# Patient Record
Sex: Female | Born: 1945 | Race: White | Hispanic: No | Smoking: Former smoker
Health system: Southern US, Community
[De-identification: ages and names within clinical notes are randomized; demographics above are authoritative.]

## PROBLEM LIST (undated history)

## (undated) DIAGNOSIS — E559 Vitamin D deficiency, unspecified: Secondary | ICD-10-CM

## (undated) DIAGNOSIS — E785 Hyperlipidemia, unspecified: Secondary | ICD-10-CM

## (undated) DIAGNOSIS — I219 Acute myocardial infarction, unspecified: Secondary | ICD-10-CM

## (undated) DIAGNOSIS — I739 Peripheral vascular disease, unspecified: Secondary | ICD-10-CM

## (undated) DIAGNOSIS — F329 Major depressive disorder, single episode, unspecified: Secondary | ICD-10-CM

## (undated) DIAGNOSIS — I2510 Atherosclerotic heart disease of native coronary artery without angina pectoris: Secondary | ICD-10-CM

## (undated) DIAGNOSIS — I252 Old myocardial infarction: Secondary | ICD-10-CM

## (undated) DIAGNOSIS — G43909 Migraine, unspecified, not intractable, without status migrainosus: Secondary | ICD-10-CM

## (undated) DIAGNOSIS — I10 Essential (primary) hypertension: Secondary | ICD-10-CM

## (undated) DIAGNOSIS — F32A Depression, unspecified: Secondary | ICD-10-CM

## (undated) DIAGNOSIS — F419 Anxiety disorder, unspecified: Secondary | ICD-10-CM

## (undated) HISTORY — DX: Migraine, unspecified, not intractable, without status migrainosus: G43.909

## (undated) HISTORY — DX: Anxiety disorder, unspecified: F41.9

## (undated) HISTORY — DX: Atherosclerotic heart disease of native coronary artery without angina pectoris: I25.10

## (undated) HISTORY — DX: Old myocardial infarction: I25.2

## (undated) HISTORY — DX: Vitamin D deficiency, unspecified: E55.9

## (undated) HISTORY — DX: Peripheral vascular disease, unspecified: I73.9

---

## 1991-02-04 HISTORY — PX: ABDOMINAL HYSTERECTOMY: SHX81

## 2003-12-18 ENCOUNTER — Ambulatory Visit: Payer: Self-pay | Admitting: *Deleted

## 2006-03-06 HISTORY — PX: CORONARY STENT PLACEMENT: SHX1402

## 2006-03-17 ENCOUNTER — Emergency Department: Payer: Self-pay | Admitting: Emergency Medicine

## 2006-03-17 ENCOUNTER — Other Ambulatory Visit: Payer: Self-pay

## 2008-02-15 ENCOUNTER — Ambulatory Visit: Payer: Self-pay | Admitting: Family Medicine

## 2009-05-03 ENCOUNTER — Ambulatory Visit: Payer: Self-pay | Admitting: Internal Medicine

## 2011-09-14 ENCOUNTER — Emergency Department: Payer: Self-pay | Admitting: Emergency Medicine

## 2011-09-14 LAB — CBC
HGB: 14.6 g/dL (ref 12.0–16.0)
MCH: 29.7 pg (ref 26.0–34.0)
MCHC: 34.7 g/dL (ref 32.0–36.0)
Platelet: 210 10*3/uL (ref 150–440)
RBC: 4.91 10*6/uL (ref 3.80–5.20)
WBC: 9.5 10*3/uL (ref 3.6–11.0)

## 2011-09-14 LAB — COMPREHENSIVE METABOLIC PANEL
Anion Gap: 9 (ref 7–16)
BUN: 13 mg/dL (ref 7–18)
Bilirubin,Total: 1.1 mg/dL — ABNORMAL HIGH (ref 0.2–1.0)
Chloride: 106 mmol/L (ref 98–107)
Creatinine: 1.02 mg/dL (ref 0.60–1.30)
EGFR (African American): >60
Osmolality: 287 (ref 275–301)
Potassium: 4.6 mmol/L (ref 3.5–5.1)
SGOT(AST): 23 U/L (ref 15–37)
Total Protein: 7.8 g/dL (ref 6.4–8.2)

## 2012-04-24 ENCOUNTER — Emergency Department: Payer: Self-pay | Admitting: Emergency Medicine

## 2012-12-06 IMAGING — CR DG CHEST 2V
1 series · 2 of 2 positions shown · non-contrast
Comparison: none

REASON FOR EXAM: htn
COMMENTS:

PROCEDURE:     DXR - DXR CHEST PA (OR AP) AND LATERAL  - September 14, 2011  [DATE]
RESULT:     The lungs are clear. The cardiac silhouette and visualized bony
skeleton are unremarkable.

[Series 1: pa · 0.17mm/px · 2 of 2 slices shown]
[im 1/2]
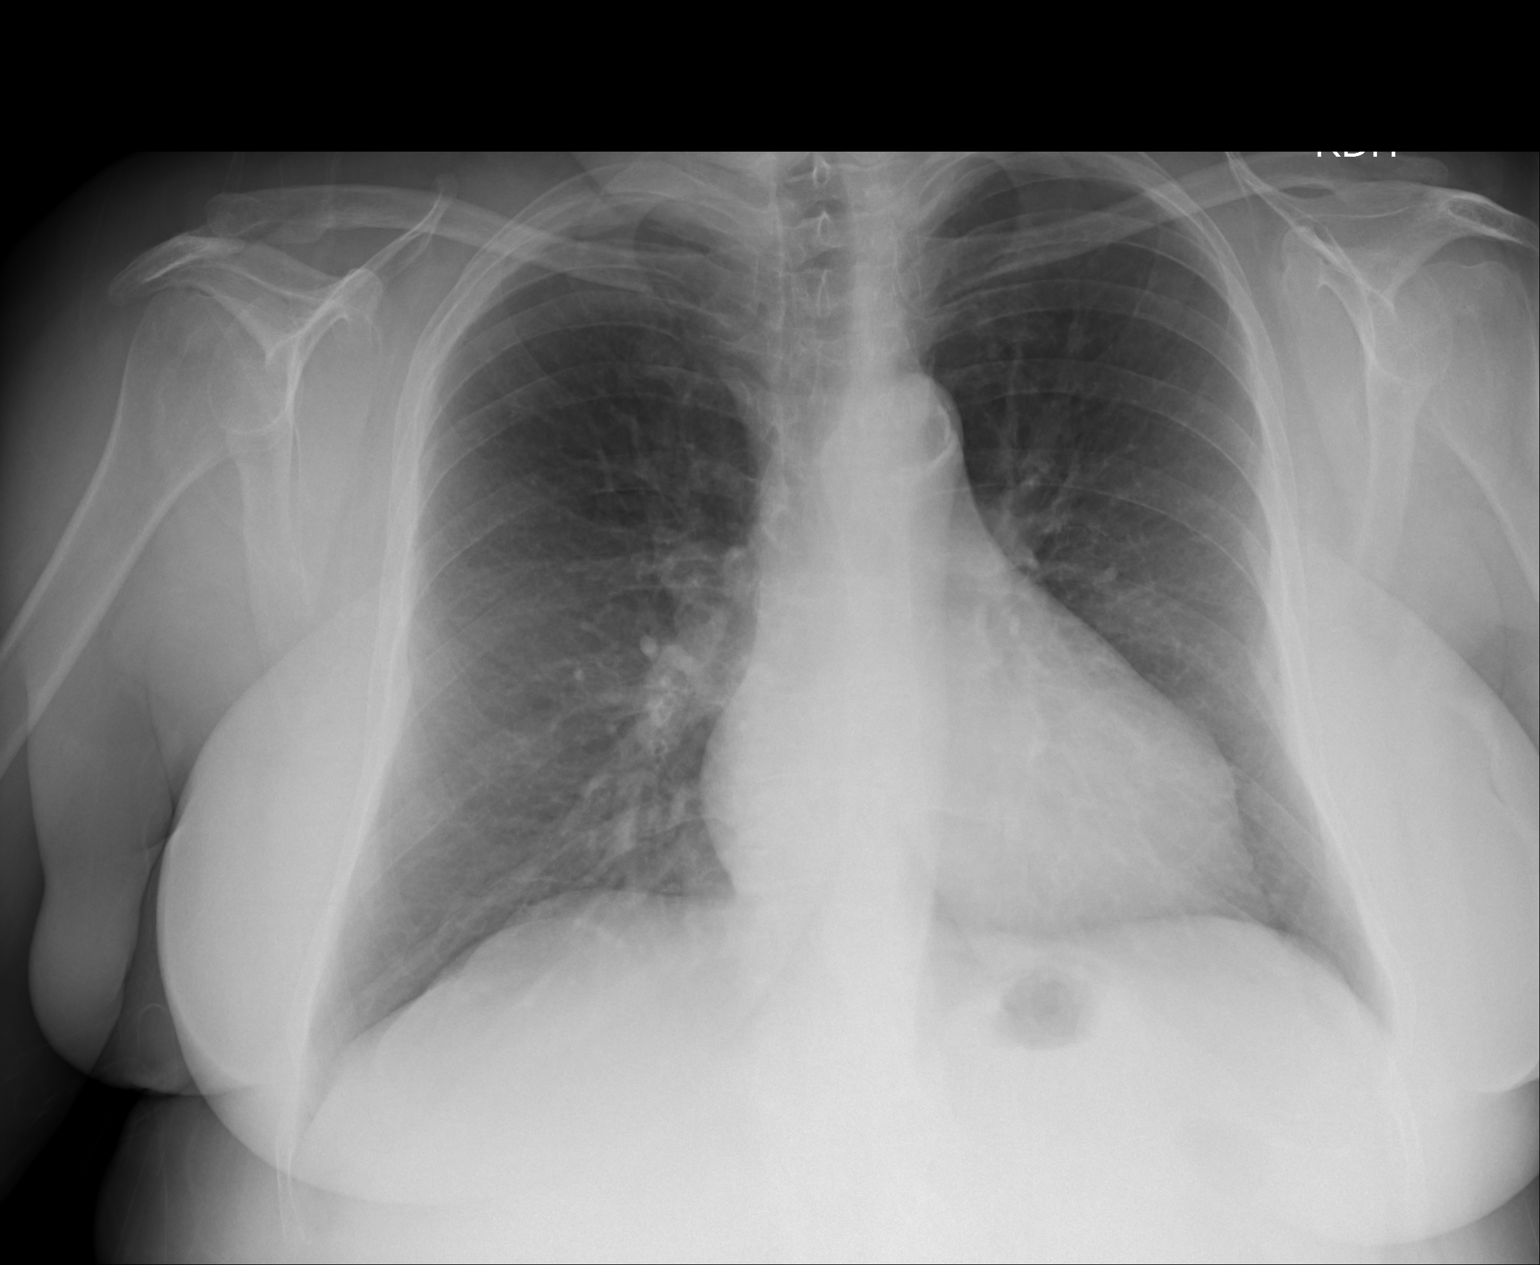
[im 2/2]
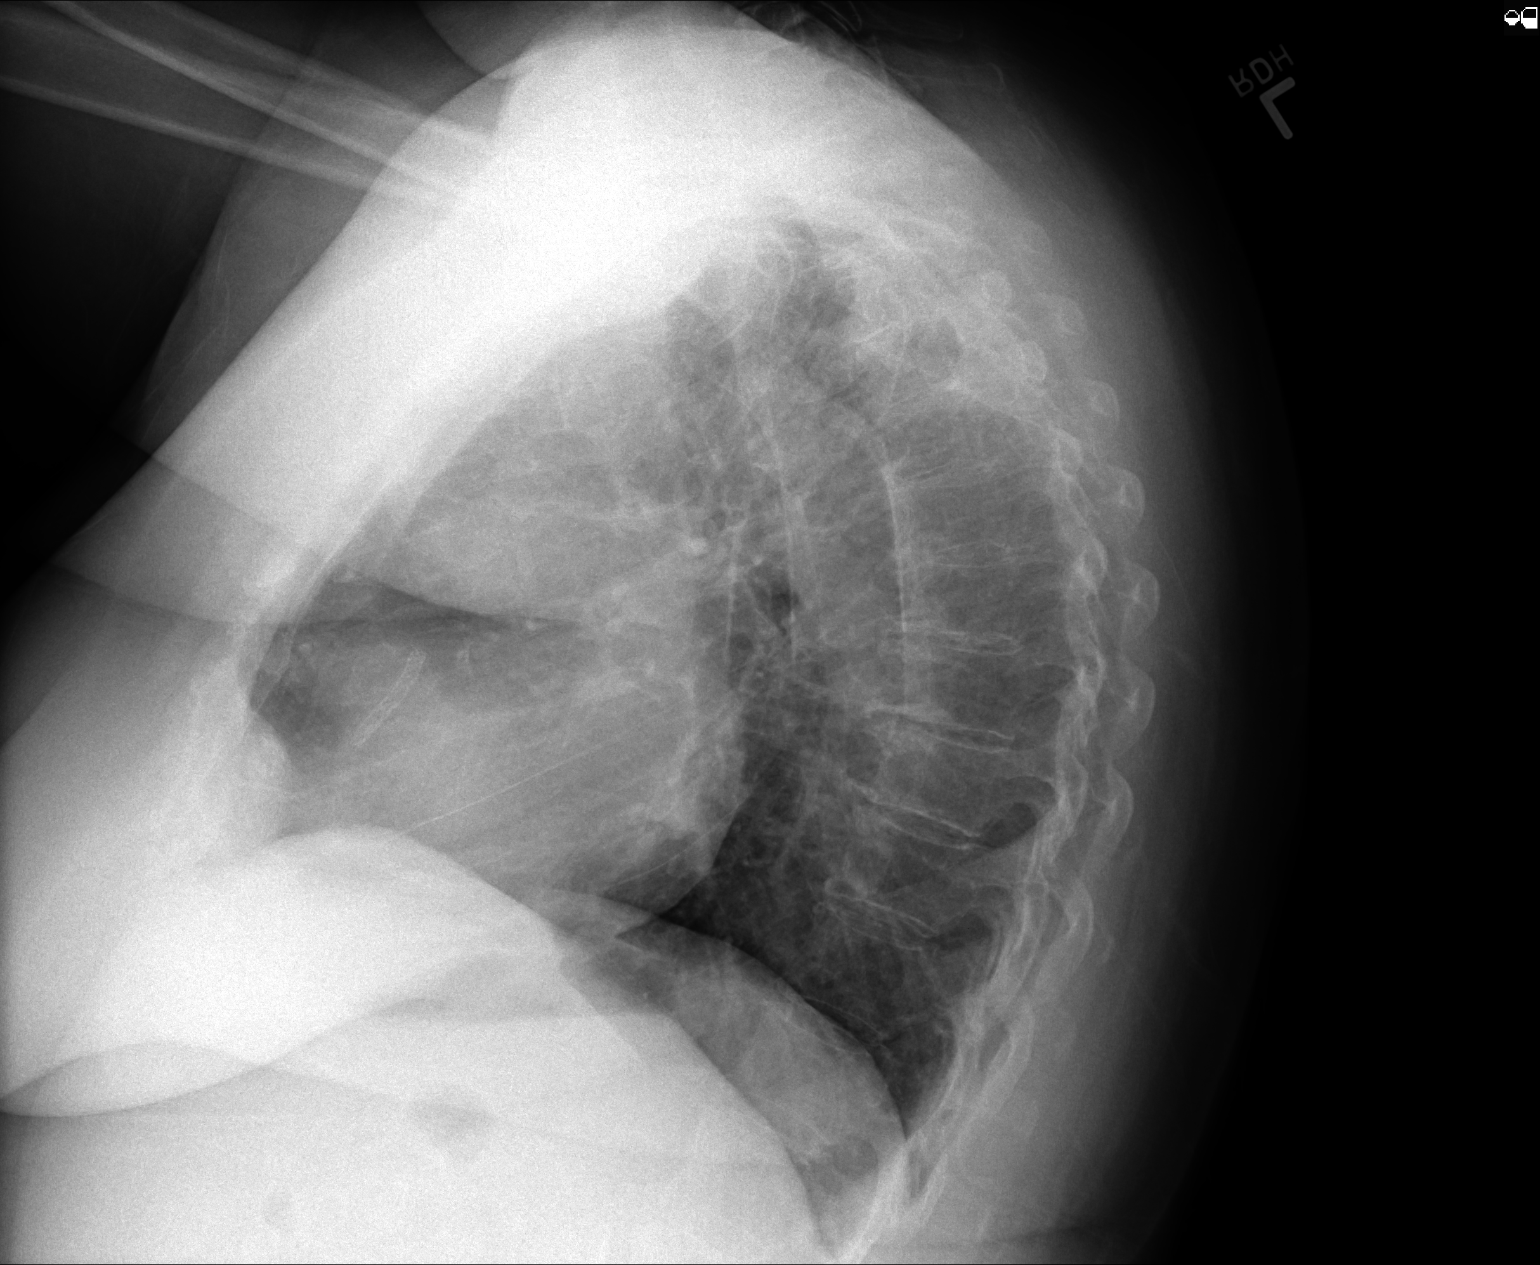

[2 of 2 positions shown; findings below may reference images not displayed]

IMPRESSION: 1.  Chest radiograph without evidence of acute cardiopulmonary disease.
2.  Comparison is made to a prior study dated 05/03/2009.

## 2013-07-17 IMAGING — CT CT HEAD WITHOUT CONTRAST
1 series · 16 of 30 positions shown, 20 images · non-contrast
Comparison: none

REASON FOR EXAM: head injury on asa
COMMENTS:   LMP: Post-Menopausal

[Series 2: soft tissue · axial · 0.43mm/px · z∈[-155,-20]mm · 16 of 30 slices shown, 20 images]
[im 2/30  brain]
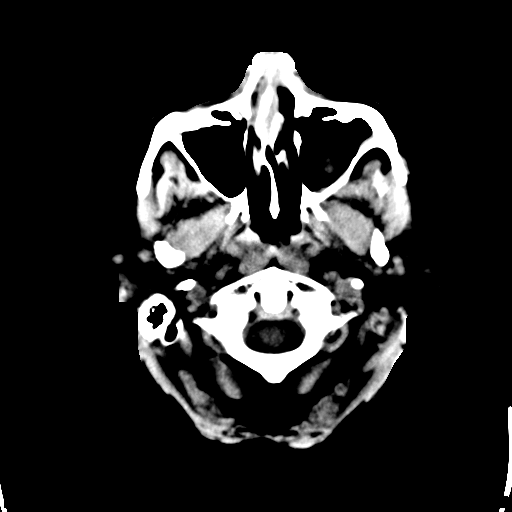
[im 2/30  bone]
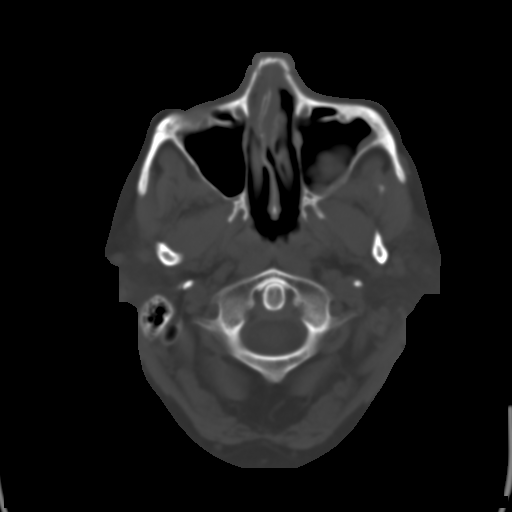
[im 4/30  brain]
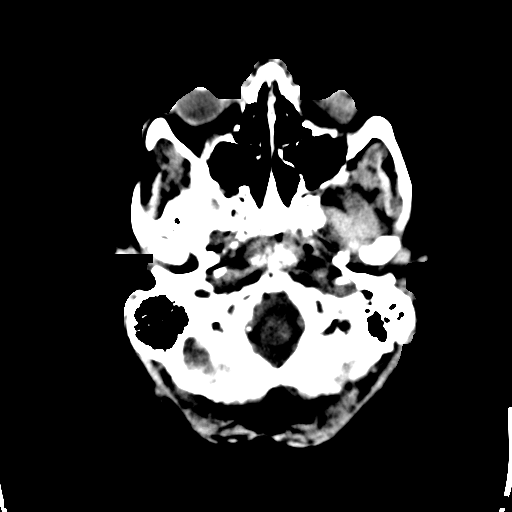
[im 6/30  brain]
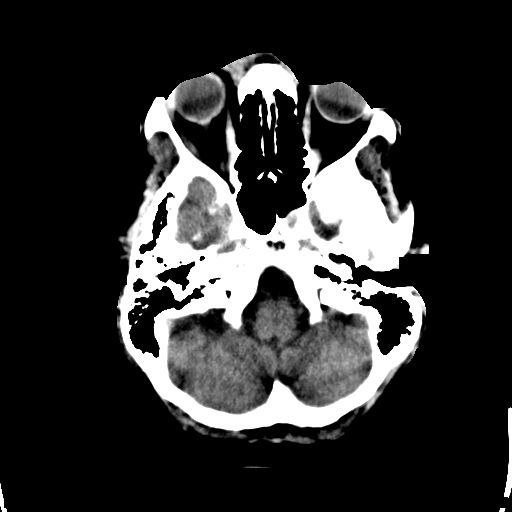
[im 8/30  brain]
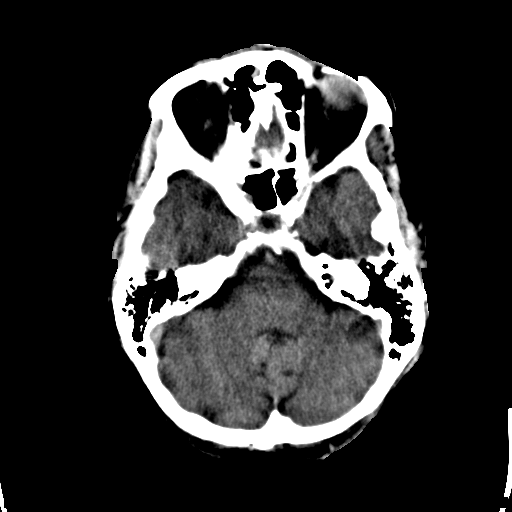
[im 9/30  brain]
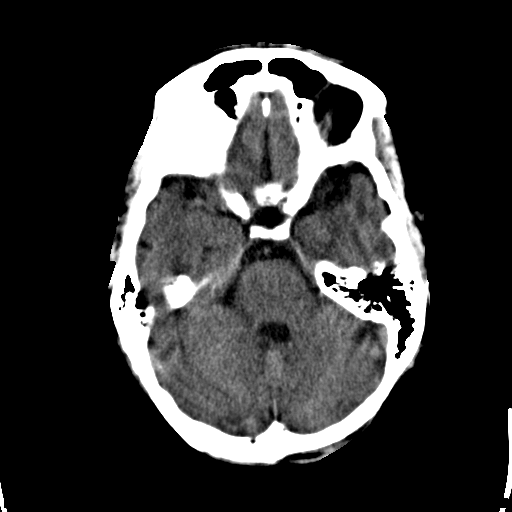
[im 9/30  bone]
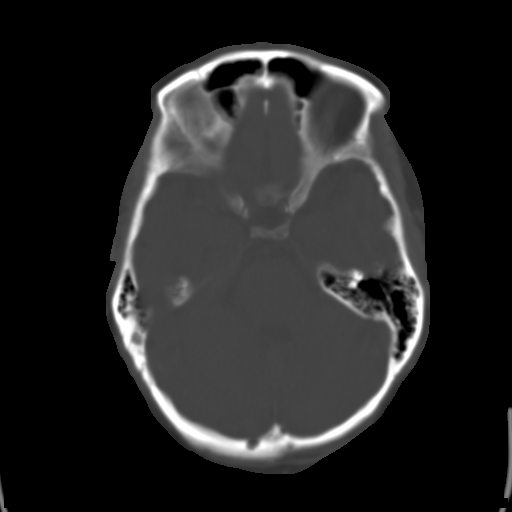
[im 11/30  brain]
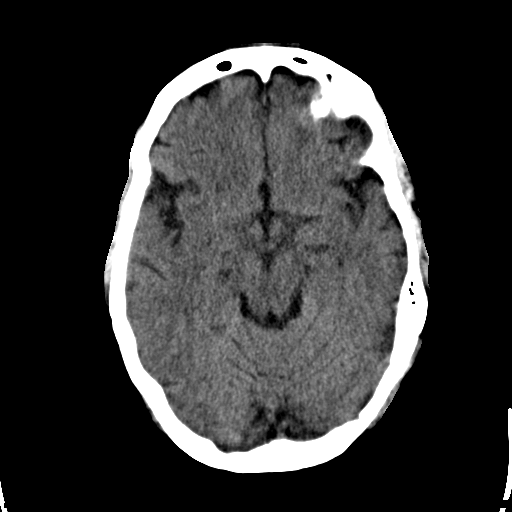
[im 13/30  brain]
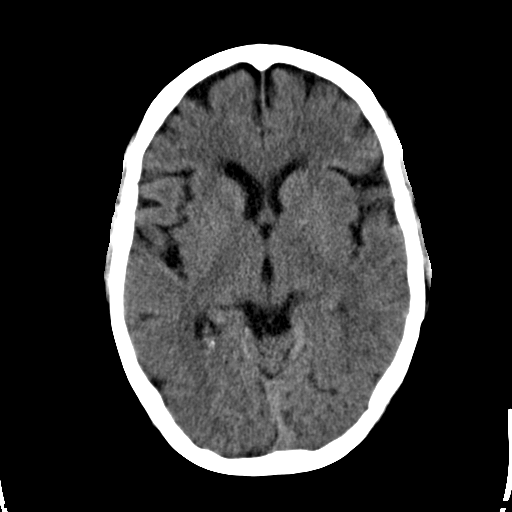
[im 15/30  brain]
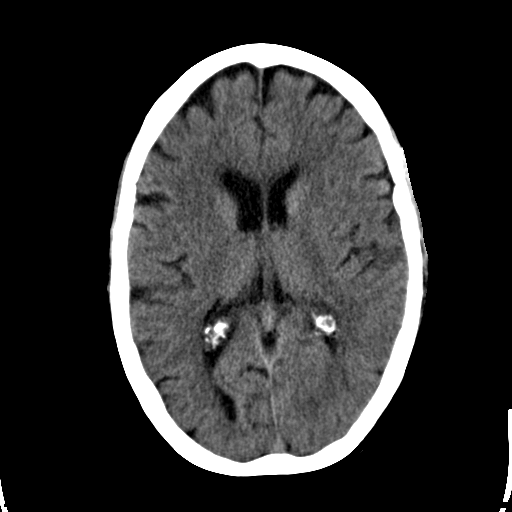
[im 16/30  brain]
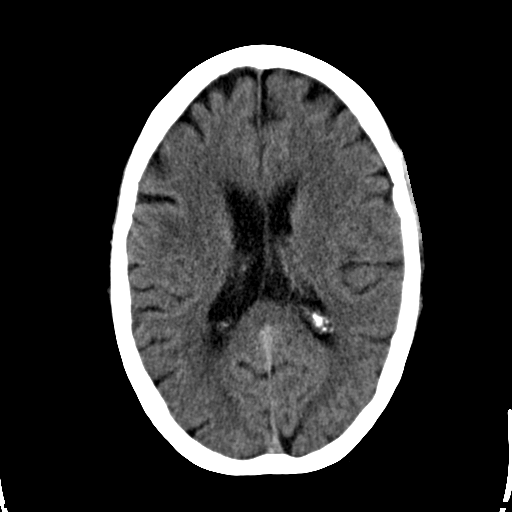
[im 16/30  bone]
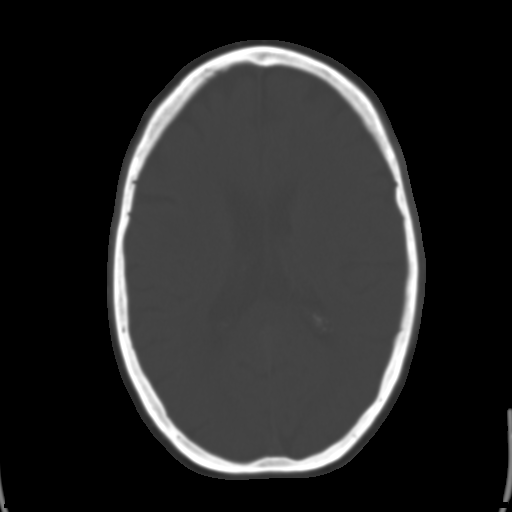
[im 18/30  brain]
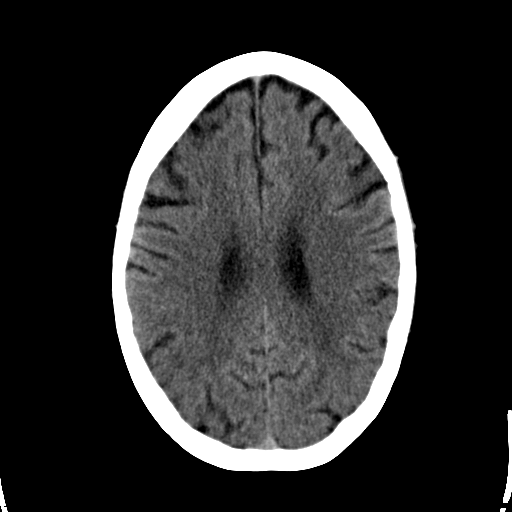
[im 20/30  brain]
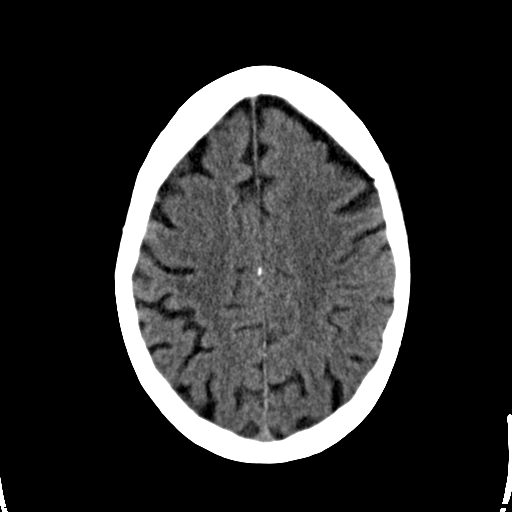
[im 22/30  brain]
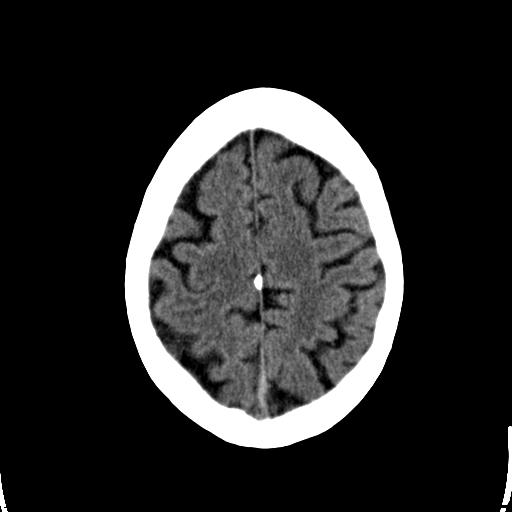
[im 23/30  brain]
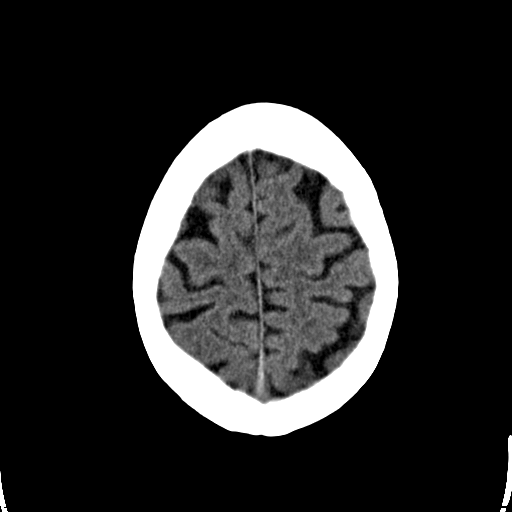
[im 23/30  bone]
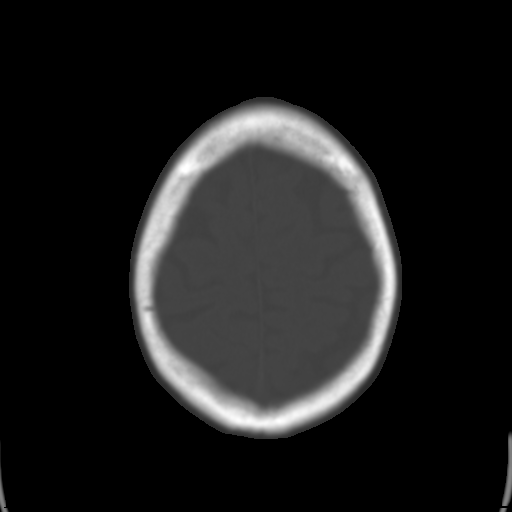
[im 25/30  brain]
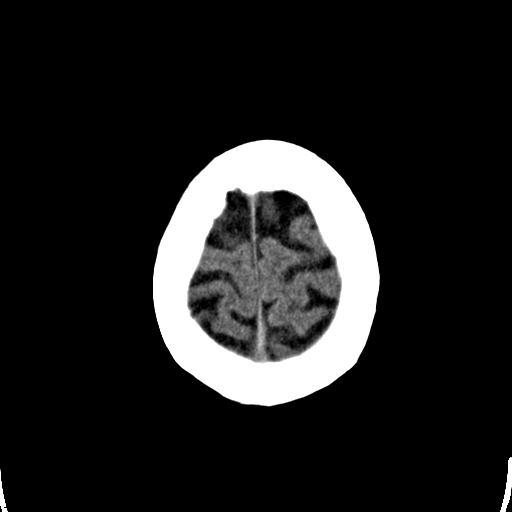
[im 27/30  brain]
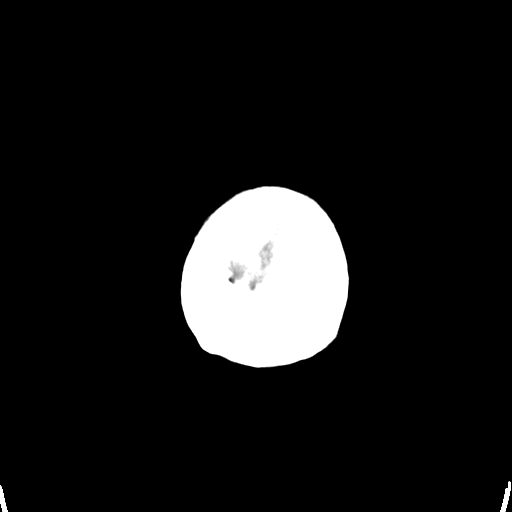
[im 29/30  brain]
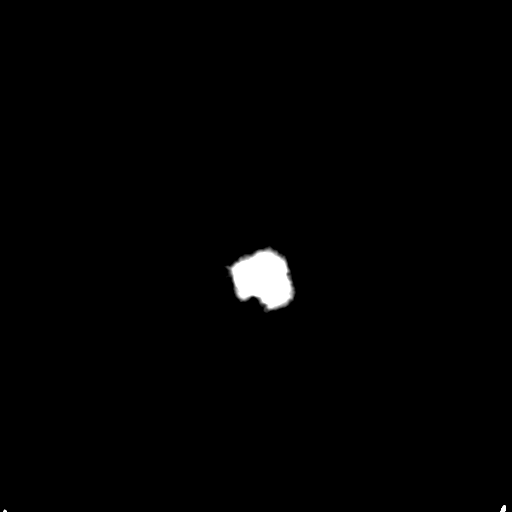

[16 of 30 positions shown; findings below may reference images not displayed]

PROCEDURE:     CT  - CT HEAD WITHOUT CONTRAST  - April 24, 2012  [DATE]

RESULT:     Noncontrast CT of the brain is performed. The patient has no
previous exam for comparison.

There is a moderately large mucous retention cyst in the left maxillary
sinus measuring up to is much as 2.5 cm. There is prominence of the
ventricles and sulci consistent with mild atrophy within normal limits for
the patient's age. There is no intracranial hemorrhage, mass, mass effect or
large territorial infarct. There is nasal bone fracture bilaterally with
displacement. The calvarium appears to be intact.
IMPRESSION: 1. Bilateral nasal bone fractures. No acute intracranial abnormality. Left
maxillary sinus mucous retention cysts. Soft tissue hematoma in the
supranasal and frontal region.

[REDACTED]

## 2014-07-12 ENCOUNTER — Other Ambulatory Visit: Payer: Self-pay | Admitting: Family Medicine

## 2014-07-12 DIAGNOSIS — I10 Essential (primary) hypertension: Secondary | ICD-10-CM

## 2014-07-14 ENCOUNTER — Other Ambulatory Visit: Payer: Self-pay | Admitting: Family Medicine

## 2014-07-14 DIAGNOSIS — I10 Essential (primary) hypertension: Secondary | ICD-10-CM

## 2014-07-14 NOTE — Telephone Encounter (Signed)
Pt is requesting a refill on Clonidine HCI 0.1mg . It was last sent in on 09/20/11 by Dr. Sherrie Mustache and was last seen for OV 07/12/13 with Nadine Counts. Pt stated she can not come in today and doesn't know when she can come because of her schedule. Pt stated her BP was 185/85 and that when she went to hospital awhile back ( pt couldn't remember the when this was) she was told to always have this medication with her. I advised pt that it is very important for her to come in for follow up. I advised pt if her BP got higher she needed to call back. Thanks TNP

## 2014-07-18 ENCOUNTER — Encounter: Payer: Self-pay | Admitting: Emergency Medicine

## 2014-07-18 ENCOUNTER — Other Ambulatory Visit: Payer: Self-pay

## 2014-07-18 ENCOUNTER — Telehealth: Payer: Self-pay | Admitting: Family Medicine

## 2014-07-18 ENCOUNTER — Other Ambulatory Visit: Payer: Self-pay | Admitting: Family Medicine

## 2014-07-18 ENCOUNTER — Emergency Department
Admission: EM | Admit: 2014-07-18 | Discharge: 2014-07-18 | Disposition: A | Discharge status: Home / Self Care | Payer: PPO | Attending: Emergency Medicine | Admitting: Emergency Medicine

## 2014-07-18 DIAGNOSIS — Z87891 Personal history of nicotine dependence: Secondary | ICD-10-CM | POA: Insufficient documentation

## 2014-07-18 DIAGNOSIS — I10 Essential (primary) hypertension: Secondary | ICD-10-CM | POA: Insufficient documentation

## 2014-07-18 DIAGNOSIS — F419 Anxiety disorder, unspecified: Secondary | ICD-10-CM | POA: Insufficient documentation

## 2014-07-18 DIAGNOSIS — Z79899 Other long term (current) drug therapy: Secondary | ICD-10-CM | POA: Diagnosis not present

## 2014-07-18 DIAGNOSIS — I252 Old myocardial infarction: Secondary | ICD-10-CM | POA: Diagnosis not present

## 2014-07-18 DIAGNOSIS — M79602 Pain in left arm: Secondary | ICD-10-CM | POA: Diagnosis not present

## 2014-07-18 DIAGNOSIS — Z7982 Long term (current) use of aspirin: Secondary | ICD-10-CM | POA: Diagnosis not present

## 2014-07-18 DIAGNOSIS — Z880 Allergy status to penicillin: Secondary | ICD-10-CM | POA: Diagnosis not present

## 2014-07-18 HISTORY — DX: Essential (primary) hypertension: I10

## 2014-07-18 HISTORY — DX: Depression, unspecified: F32.A

## 2014-07-18 HISTORY — DX: Major depressive disorder, single episode, unspecified: F32.9

## 2014-07-18 HISTORY — DX: Hyperlipidemia, unspecified: E78.5

## 2014-07-18 HISTORY — DX: Acute myocardial infarction, unspecified: I21.9

## 2014-07-18 LAB — TROPONIN I: Troponin I: <0.03 ng/mL (ref <0.031)

## 2014-07-18 LAB — CBC
HCT: 39.7 % (ref 35.0–47.0)
HEMOGLOBIN: 13.4 g/dL (ref 12.0–16.0)
MCH: 29.6 pg (ref 26.0–34.0)
MCHC: 33.8 g/dL (ref 32.0–36.0)
MCV: 87.4 fL (ref 80.0–100.0)
Platelets: 212 10*3/uL (ref 150–440)
RBC: 4.54 MIL/uL (ref 3.80–5.20)
RDW: 14.3 % (ref 11.5–14.5)
WBC: 7.9 10*3/uL (ref 3.6–11.0)

## 2014-07-18 LAB — BASIC METABOLIC PANEL
Anion gap: 12 (ref 5–15)
BUN: 13 mg/dL (ref 6–20)
CHLORIDE: 104 mmol/L (ref 101–111)
CO2: 23 mmol/L (ref 22–32)
CREATININE: 1.05 mg/dL — AB (ref 0.44–1.00)
Calcium: 8.8 mg/dL — ABNORMAL LOW (ref 8.9–10.3)
GFR calc non Af Amer: 53 mL/min — ABNORMAL LOW (ref >60)
Glucose, Bld: 153 mg/dL — ABNORMAL HIGH (ref 65–99)
Potassium: 3.9 mmol/L (ref 3.5–5.1)
Sodium: 139 mmol/L (ref 135–145)

## 2014-07-18 MED ORDER — CLONIDINE HCL 0.1 MG PO TABS: 0.1000 mg | ORAL_TABLET | ORAL | Status: AC

## 2014-07-18 NOTE — Discharge Instructions (Signed)

## 2014-07-18 NOTE — ED Provider Notes (Signed)
[facility] Emergency Department Provider Note  ____________________________________________  Time seen: On arrival  I have reviewed the triage vital signs and the nursing notes.   HISTORY  Chief Complaint Arm Pain      HPI Angela Santiago is a 69 y.o. female who presents after left arm cramping. Patient reports she was washing dishes and developed spasming in her left arm which was moderate in nature but quickly resolved. This concerned her because she had a heart attack in the distant past, although the symptoms then were much different so she went to check her blood pressure and found it to be significantly elevated. This made her anxious and that is why she came to the emergency department. She has had no chest pain, she has no shortness of breath. Overall she feels well currently     Past Medical History  Diagnosis Date  . Hypertension   . Hyperlipidemia   . Depression   . Myocardial infarct     There are no active problems to display for this patient.   Past Surgical History  Procedure Laterality Date  . Abdominal hysterectomy      Current Outpatient Rx  Name  Route  Sig  Dispense  Refill  . aspirin 325 MG tablet   Oral   Take by mouth.         . Calcium Carbonate-Vitamin D 600-200 MG-UNIT CAPS   Oral   Take by mouth.         . cloNIDine (CATAPRES) 0.1 MG tablet   Oral   Take 1 tablet (0.1 mg total) by mouth every 4 (four) hours. As needed for systolic bp > 170 or diastolic bp > 100   30 tablet   1   . doxycycline (VIBRA-TABS) 100 MG tablet   Oral   Take by mouth.         . hydrochlorothiazide (HYDRODIURIL) 12.5 MG tablet   Oral   Take by mouth.         Marland Kitchen HYDROcodone-homatropine (HYCODAN) 5-1.5 MG/5ML syrup   Oral   Take by mouth.         . losartan (COZAAR) 100 MG tablet      TAKE 1 TABLET BY MOUTH DAILY   90 tablet   3   . nitroGLYCERIN (NITROSTAT) 0.4 MG SL tablet   Sublingual   Place under the  tongue.         . Omega-3 Fatty Acids (FISH OIL) 1000 MG CAPS   Oral   Take by mouth.         . pravastatin (PRAVACHOL) 80 MG tablet   Oral   Take by mouth.         . sertraline (ZOLOFT) 50 MG tablet   Oral   Take by mouth.           Allergies Amoxicillin; Lipitor ; and Penicillins  No family history on file.  Social History History  Substance Use Topics  . Smoking status: Former Games developer  . Smokeless tobacco: Not on file  . Alcohol Use: No    Review of Systems  Constitutional: Negative for fever. Eyes: Negative for visual changes. ENT: Negative for sore throat Cardiovascular: Negative for chest pain. Respiratory: Negative for shortness of breath. Gastrointestinal: Negative for abdominal pain, vomiting and diarrhea. Genitourinary: Negative for dysuria. Musculoskeletal: Negative for back pain. Skin: Negative for rash. Neurological: Negative for headaches or focal weakness Psychiatric: Positive for anxiety  10-point ROS otherwise negative.  ____________________________________________  PHYSICAL EXAM:  VITAL SIGNS: ED Triage Vitals  Enc Vitals Group     BP 07/18/14 1452 171/71 mmHg     Pulse Rate 07/18/14 1452 73     Resp 07/18/14 1452 18     Temp 07/18/14 1452 98.2 F (36.8 C)     Temp Source 07/18/14 1452 Oral     SpO2 07/18/14 1452 99 %     Weight 07/18/14 1452 250 lb (113.399 kg)     Height 07/18/14 1452  (1.575 m)     Head Cir --      Peak Flow --      Pain Score 07/18/14 1453 0     Pain Loc --      Pain Edu? --      Excl. in GC? --      Constitutional: Alert and oriented. Well appearing and in no distress. Eyes: Conjunctivae are normal. PERRL. ENT   Head: Normocephalic and atraumatic.   Nose: No rhinnorhea.   Mouth/Throat: Mucous membranes are moist. Cardiovascular: Normal rate, regular rhythm. Normal and symmetric distal pulses are present in all extremities. No murmurs, rubs, or gallops. Respiratory: Normal  respiratory effort without tachypnea nor retractions. Breath sounds are clear and equal bilaterally.  Gastrointestinal: Soft and non-tender in all quadrants. No distention. There is no CVA tenderness. Genitourinary: deferred Musculoskeletal: Nontender with normal range of motion in all extremities. No lower extremity tenderness nor edema. Neurologic:  Normal speech and language. No gross focal neurologic deficits are appreciated. Skin:  Skin is warm, dry and intact. No rash noted. Psychiatric: Mood and affect are normal. Patient exhibits appropriate insight and judgment.  ____________________________________________    LABS (pertinent positives/negatives)  Labs Reviewed  BASIC METABOLIC PANEL - Abnormal; Notable for the following:    Glucose, Bld 153 (*)    Creatinine, Ser 1.05 (*)    Calcium 8.8 (*)    GFR calc non Af Amer 53 (*)    All other components within normal limits  CBC  TROPONIN I  TROPONIN I    ____________________________________________   EKG  ED ECG REPORT I, Jene Every, the attending physician, personally viewed and interpreted this ECG.  Date: 07/18/2014 EKG Time: 2:53 PM Rate: 74 Rhythm: normal sinus rhythm QRS Axis: normal Intervals: normal ST/T Wave abnormalities: normal Conduction Disutrbances: none Narrative Interpretation: unremarkable   ____________________________________________    RADIOLOGY  None  ____________________________________________   PROCEDURES  Procedure(s) performed: none  Critical Care performed: none  ____________________________________________   INITIAL IMPRESSION / ASSESSMENT AND PLAN / ED COURSE  Pertinent labs & imaging results that were available during my care of the patient were reviewed by me and considered in my medical decision making (see chart for details).  Patient well-appearing. EKG unremarkable. Troponin negative 2. This seems to be purely musculoskeletal and not related to ACS or her  chest at all. Patient reassured. Patient to follow-up with PCP  ____________________________________________   FINAL CLINICAL IMPRESSION(S) / ED DIAGNOSES  Final diagnoses:  Pain of left upper extremity     Jene Every, MD 07/18/14 [phone]

## 2014-07-18 NOTE — Telephone Encounter (Signed)
Refill request for Clonidine HCI 0.1mg  Last filled by MD on- 09/20/2011 #30 x0 Last Appt: 07/12/2013 by  Next Appt: 07/19/2014 Please advise refill?

## 2014-07-18 NOTE — ED Notes (Signed)
Pt to ed with c/o left arm "cramping"  That started while she was washing dishes today at 1330.  Pt states pain lasted about 15 minutes and then she took a clonidine because she checked her blood pressure and it was elevated.  Pt denies chest pain and denies sob.

## 2014-07-18 NOTE — ED Notes (Signed)
Pt informed to return if any life threatening symptoms occur.  

## 2014-07-18 NOTE — Telephone Encounter (Signed)
07/18/2014. 1405. Pt called c/o left arm "cramping" that started right before she called office while washing dishes. Pt checked her BP and it read 193/70-"something".Pt reports last night her BP was normal, about 127/72. FYI- Pt reports that she requested a refill on Clonidine for when SBP > 180; pt reports she was denied the prescription and [facility] [address]. Pt then states that the pain could be secondary to an arm injury in 1988. When asking about associated sx, pt admits she is experiencing palpitation, and the arm pain radiated to her jaw. Pt denies chest pain, SOB, N/V, but does state she "hasn't felt well" in the last "couple days". Pt is currently being treated for HTN, hyperlipidemia, and has had an MI in the past. Advised pt to go to ED. Pt was reluctant, so Dr. Elease Hashimoto advised pt to call EMS and go to emergency room. Pt then stated that she wanted to take a shower before going to the ED. Pt was advised NOT to take a shower and call EMS immediately. Pt has appointment with Nadine Counts tomorrow morning. Allene Dillon, CMA

## 2014-07-19 ENCOUNTER — Ambulatory Visit (INDEPENDENT_AMBULATORY_CARE_PROVIDER_SITE_OTHER): Payer: PPO | Admitting: Family Medicine

## 2014-07-19 ENCOUNTER — Encounter: Payer: Self-pay | Admitting: Family Medicine

## 2014-07-19 VITALS — BP 116/62 | HR 63 | Temp 97.8°F | Resp 16 | Ht 62.0 in | Wt 217.2 lb

## 2014-07-19 DIAGNOSIS — M25572 Pain in left ankle and joints of left foot: Secondary | ICD-10-CM | POA: Diagnosis not present

## 2014-07-19 DIAGNOSIS — F439 Reaction to severe stress, unspecified: Secondary | ICD-10-CM | POA: Insufficient documentation

## 2014-07-19 DIAGNOSIS — I779 Disorder of arteries and arterioles, unspecified: Secondary | ICD-10-CM | POA: Insufficient documentation

## 2014-07-19 DIAGNOSIS — G43909 Migraine, unspecified, not intractable, without status migrainosus: Secondary | ICD-10-CM | POA: Insufficient documentation

## 2014-07-19 DIAGNOSIS — G8929 Other chronic pain: Secondary | ICD-10-CM

## 2014-07-19 DIAGNOSIS — I059 Rheumatic mitral valve disease, unspecified: Secondary | ICD-10-CM | POA: Insufficient documentation

## 2014-07-19 DIAGNOSIS — R7309 Other abnormal glucose: Secondary | ICD-10-CM | POA: Insufficient documentation

## 2014-07-19 DIAGNOSIS — J309 Allergic rhinitis, unspecified: Secondary | ICD-10-CM | POA: Insufficient documentation

## 2014-07-19 DIAGNOSIS — E7800 Pure hypercholesterolemia, unspecified: Secondary | ICD-10-CM | POA: Insufficient documentation

## 2014-07-19 DIAGNOSIS — E559 Vitamin D deficiency, unspecified: Secondary | ICD-10-CM | POA: Insufficient documentation

## 2014-07-19 DIAGNOSIS — I10 Essential (primary) hypertension: Secondary | ICD-10-CM | POA: Insufficient documentation

## 2014-07-19 DIAGNOSIS — G5710 Meralgia paresthetica, unspecified lower limb: Secondary | ICD-10-CM | POA: Insufficient documentation

## 2014-07-19 DIAGNOSIS — I219 Acute myocardial infarction, unspecified: Secondary | ICD-10-CM | POA: Insufficient documentation

## 2014-07-19 DIAGNOSIS — F419 Anxiety disorder, unspecified: Secondary | ICD-10-CM | POA: Insufficient documentation

## 2014-07-19 NOTE — Patient Instructions (Signed)
Further f/u after you see podiatry

## 2014-07-19 NOTE — Progress Notes (Signed)
Subjective:     Patient ID: Angela Santiago, female   DOB: 1945-11-21, 69 y.o.   MRN: [id number]  HPI  Chief Complaint  Patient presents with  . Hospitalization Follow-up    Patient is present today in office for transtion into care from [facility] ER. Patient states that she called office yesterday and spoke with Dr. Elease Hashimoto with complaints of pain and cramping in left arm radiating to left side of neck and blood pressure of 193/70's. Patient has history of MI in 2008  She was ruled out for M.I.and told to f/u with [place] today. She does not see [place] or other doctors on a regular basis due to poor finances. Does not want further blood work today as she was a difficult stick in the ER. Primary limitation in her life at this time is chronic left ankle pain with weight bearing from prior ORIF for fx in 2003 [facility]). Subsequently can not walk sufficiently for exercise and is distressed by her weight and abdominal pannus.   Review of Systems  Musculoskeletal:       Reports cramps in arm and legs at times. Arm cramp precipitated the ER evaluation.  Neurological: Positive for tremors (intention tremors affecting abiility to eat and write. Did not complete neurology evaluation due to  $ issues.). Dizziness: chronic.       Objective:   Physical Exam  Constitutional: She appears well-developed and well-nourished. She appears distressed.  Neck: Full passive range of motion without pain. Carotid bruit is not present.  Cardiovascular: Normal rate and regular rhythm.   Pulmonary/Chest: Breath sounds normal.  Musculoskeletal: She exhibits no edema.       Assessment:     1. Chronic ankle pain, left - Ambulatory referral to Podiatry    Plan:    Will see her back after podiatry evaluation. Consider physical therapy.

## 2014-08-04 ENCOUNTER — Telehealth: Payer: Self-pay | Admitting: Family Medicine

## 2014-09-15 ENCOUNTER — Other Ambulatory Visit: Payer: Self-pay | Admitting: Family Medicine

## 2014-10-23 ENCOUNTER — Other Ambulatory Visit: Payer: Self-pay | Admitting: Family Medicine

## 2014-11-09 ENCOUNTER — Encounter: Payer: Self-pay | Admitting: Family Medicine

## 2014-11-09 ENCOUNTER — Ambulatory Visit (INDEPENDENT_AMBULATORY_CARE_PROVIDER_SITE_OTHER): Payer: PPO | Admitting: Family Medicine

## 2014-11-09 VITALS — BP 120/82 | HR 68 | Temp 97.8°F | Resp 14 | Wt 218.8 lb

## 2014-11-09 DIAGNOSIS — I10 Essential (primary) hypertension: Secondary | ICD-10-CM | POA: Diagnosis not present

## 2014-11-09 DIAGNOSIS — R350 Frequency of micturition: Secondary | ICD-10-CM

## 2014-11-09 DIAGNOSIS — M546 Pain in thoracic spine: Secondary | ICD-10-CM

## 2014-11-09 DIAGNOSIS — E7800 Pure hypercholesterolemia, unspecified: Secondary | ICD-10-CM

## 2014-11-09 LAB — POCT URINALYSIS DIPSTICK
BILIRUBIN UA: NEGATIVE
Glucose, UA: NEGATIVE
Ketones, UA: NEGATIVE
Leukocytes, UA: NEGATIVE
Nitrite, UA: NEGATIVE
Protein, UA: NEGATIVE
RBC UA: NEGATIVE
SPEC GRAV UA: 1.01
UROBILINOGEN UA: 0.2
pH, UA: 5

## 2014-11-09 NOTE — Patient Instructions (Signed)
Discussed continued Tylenol (up to 3000 mg./day) and warm compresses to her right back for 20 minutes. Watch for shingles rash.

## 2014-11-09 NOTE — Progress Notes (Signed)
Subjective:     Patient ID: Angela Santiago, female   DOB: 18-Aug-1945, 69 y.o.   MRN: [id number]  HPI  Chief Complaint  Patient presents with  . Back Pain    Patient comes in office today with concerns  of back pain for thre past two days. Patient reports back pain is located on right side near bra line, she describes pain as a dull ache  . Urinary Frequency    Patient reports urinary frequency that began last night, she states for the past two days she has had sharp pain on the right side of her groin area.   Denies injury to her back or rash. Seemed to get better with Tylenol ES but pain returned today. States she was kept up all night with telephone calls from her 75 year old brother. He is evacuating from [place] due to [employer] and coming to stay with her. Has had mild nausea and chills accompanying her current sx.   Review of Systems  Respiratory: Negative for shortness of breath.   Cardiovascular: Negative for chest pain.  Genitourinary:       Years since she has had a UTI.  Psychiatric/Behavioral:       Ongoing stress due to financial issues.       Objective:   Physical Exam  Constitutional: She appears well-developed and well-nourished. No distress.  Pulmonary/Chest: Breath sounds normal.  Localizes pain to right paravertebral thoracic area. Non-tender with no rash apparent.       Assessment:    1. Right-sided thoracic back pain: ? Musculoskeletal ? Early shingles presentation  2. Urinary frequency: ? Anxiety mediated - POCT Urinalysis Dipstick - Comprehensive metabolic panel  3. Essential hypertension - Comprehensive metabolic panel  4. Hypercholesteremia - Lipid panel    Plan:    Update labs when she is able.  Discussed use of Tylenol, warm compresses, and monitor for a rash.

## 2014-12-15 ENCOUNTER — Other Ambulatory Visit: Payer: Self-pay | Admitting: Family Medicine

## 2015-01-01 ENCOUNTER — Ambulatory Visit (INDEPENDENT_AMBULATORY_CARE_PROVIDER_SITE_OTHER): Payer: PPO | Admitting: Family Medicine

## 2015-01-01 ENCOUNTER — Encounter: Payer: Self-pay | Admitting: Family Medicine

## 2015-01-01 VITALS — BP 122/58 | HR 70 | Temp 98.2°F | Resp 16 | Wt 217.6 lb

## 2015-01-01 DIAGNOSIS — J069 Acute upper respiratory infection, unspecified: Secondary | ICD-10-CM

## 2015-01-01 NOTE — Patient Instructions (Addendum)
Discussed use of saline nasal spray, Claritin, Mucinex for chest congestion, Benadryl at night for post-nasal drainage, and Delsym for cough. May also use nasal decongestant spray for 3 days time. May call if sinuses not improving over the next week.

## 2015-01-01 NOTE — Progress Notes (Signed)
Subjective:     Patient ID: Angela Santiago, female   DOB: 1945-06-26, 69 y.o.   MRN: [id number]  HPI  Chief Complaint  Patient presents with  . Cough    Patient comes in office today with concerns of cough and congestion for the past 24hrs. Patient states that she has taken otc Claritin with no relief. Associated symptoms include: productive cough, runny nose wheezing and sinus pressure located on the left side of face and above eyes  States he daughter has been sick as well.   Review of Systems  Constitutional: Positive for chills. Negative for fever.       Objective:   Physical Exam  Constitutional: She appears well-developed and well-nourished.  Ears: T.M's intact without inflammation Sinuses: non-tender Throat: no tonsillar enlargement or exudate Neck: no cervical adenopathy Lungs: scattered short expiratory wheezes in posterior lung fields.     Assessment:    1. Upper respiratory infection    Plan:    Discussed symptomatic treatment. She is to call if sinuses not improving over the next few days.

## 2015-01-08 ENCOUNTER — Telehealth: Payer: Self-pay | Admitting: Family Medicine

## 2015-01-08 ENCOUNTER — Other Ambulatory Visit: Payer: Self-pay | Admitting: Family Medicine

## 2015-01-08 DIAGNOSIS — J019 Acute sinusitis, unspecified: Secondary | ICD-10-CM

## 2015-01-08 MED ORDER — DOXYCYCLINE HYCLATE 100 MG PO TABS: 100.0000 mg | ORAL_TABLET | Freq: Two times a day (BID) | ORAL | Status: DC

## 2015-01-08 NOTE — Telephone Encounter (Signed)
Antibiotic sent in

## 2015-01-08 NOTE — Telephone Encounter (Signed)
Pt stated she saw Nadine Counts on 01/01/15 and she isn't better and when she blows her nose blood comes out as well. Pt would like [facility] [address] Lynford Humphrey TNP

## 2015-01-08 NOTE — Telephone Encounter (Signed)
Please review note and advise. KW 

## 2015-01-08 NOTE — Telephone Encounter (Signed)
Notified patient that Rx was sent in. KW

## 2015-03-21 ENCOUNTER — Encounter: Payer: Self-pay | Admitting: Family Medicine

## 2015-03-21 ENCOUNTER — Ambulatory Visit (INDEPENDENT_AMBULATORY_CARE_PROVIDER_SITE_OTHER): Payer: PPO | Admitting: Family Medicine

## 2015-03-21 VITALS — BP 140/80 | HR 76 | Temp 98.5°F | Resp 16 | Wt 215.8 lb

## 2015-03-21 DIAGNOSIS — I10 Essential (primary) hypertension: Secondary | ICD-10-CM

## 2015-03-21 DIAGNOSIS — M25512 Pain in left shoulder: Secondary | ICD-10-CM

## 2015-03-21 NOTE — Patient Instructions (Addendum)
Nurse bp checks over the next week-bring your bp machine with you for comparison. Consider trying one to two generic Aleve twice daily with food. You may also take Tylenol as needed up to 3000 mg/day.

## 2015-03-21 NOTE — Progress Notes (Signed)
Subjective:     Patient ID: Angela Santiago, female   DOB: 15-Mar-1945, 70 y.o.   MRN: [id number]  HPI  Chief Complaint  Patient presents with  . Arm Swelling    Patient comes in office today with concerns of swelling on the back of her left arm for the past 2 weeks. Patient describes it as a dull ache and states that area is tender to the touch and warm.  . Hypertension    Patient would like to address today symptoms of elevated blood pressure, patient reports having more frequent headaches and states that she had a recent blood pressure of 239/97, patient reports that she took clonidine to see if it would help but it did not bring pressure down. Patient has a history of CHF dating back to 03/17/2006 and has concerns that she is having similar signs.   States two days ago she noted the elevated blood pressure. She took a prn clonidine as directed in the past. States she gets anxious about her shoulder pain and that adds to her symptoms. Has Tylenol E.S.for her sx. Denies specific injury.   Review of Systems     Objective:   Physical Exam  Constitutional: She appears well-developed and well-nourished. Distressed: mild anxiety.  Cardiovascular: Normal rate and regular rhythm.   Pulmonary/Chest: Breath sounds normal.  Musculoskeletal: She exhibits no edema (of lower extremities).  Grips/EF/EE/ shoulder IR/ER/F/E/abduction all 5/5. Moderately tender over her left anterior shoulder. Can range actively and passively > 90 degrees with mild discomfort.       Assessment:    1. Left anterior shoulder pain  2. Essential hypertension    Plan:    Discussed nurse bp checks with her bp machine. May take Aleve for shoulder pain. Consider x-ray and orthopedic referral if not improving.

## 2015-03-22 ENCOUNTER — Telehealth: Payer: Self-pay | Admitting: Family Medicine

## 2015-03-22 NOTE — Telephone Encounter (Signed)
Nadine Counts please advise if okay to continue.KW

## 2015-03-22 NOTE — Telephone Encounter (Signed)
Have her take the aspirin with her midday meal if she is taking the Aleve the breakfast and supper.

## 2015-03-22 NOTE — Telephone Encounter (Signed)
Pt called wanting to know if it was ok for her to continue to take the asprin along with the aleve that she was started on.  Her call back is

## 2015-03-22 NOTE — Telephone Encounter (Signed)
Patient advised as directed below.  Thanks,  -Joseline 

## 2015-03-22 NOTE — Telephone Encounter (Signed)
[id number] 

## 2015-04-26 ENCOUNTER — Other Ambulatory Visit: Payer: Self-pay | Admitting: Family Medicine

## 2015-06-18 ENCOUNTER — Telehealth: Payer: Self-pay | Admitting: Family Medicine

## 2015-06-18 ENCOUNTER — Other Ambulatory Visit: Payer: Self-pay | Admitting: Family Medicine

## 2015-06-18 DIAGNOSIS — I10 Essential (primary) hypertension: Secondary | ICD-10-CM

## 2015-06-18 MED ORDER — HYDROCHLOROTHIAZIDE 12.5 MG PO CAPS: 12.5000 mg | ORAL_CAPSULE | Freq: Every day | ORAL | Status: DC

## 2015-06-18 NOTE — Telephone Encounter (Signed)
Pt needs refill on her hydrochlorothiazide (MICROZIDE) 12.5 MG capsule Taking 12/15/14 -- Anola Gurney, PA TAKE ONE CAPSULE BY MOUTH EVERY DAY  She only has 2 left.  [facility]

## 2015-06-18 NOTE — Telephone Encounter (Signed)
Please review. Thanks!  

## 2015-06-28 ENCOUNTER — Telehealth: Payer: Self-pay | Admitting: Family Medicine

## 2015-06-28 ENCOUNTER — Other Ambulatory Visit: Payer: Self-pay | Admitting: Family Medicine

## 2015-06-28 DIAGNOSIS — E785 Hyperlipidemia, unspecified: Secondary | ICD-10-CM

## 2015-06-28 MED ORDER — PRAVASTATIN SODIUM 80 MG PO TABS: 80.0000 mg | ORAL_TABLET | Freq: Every day | ORAL | Status: DC

## 2015-06-28 NOTE — Telephone Encounter (Signed)
refilled 

## 2015-06-28 NOTE — Telephone Encounter (Signed)
Refill request

## 2015-06-28 NOTE — Telephone Encounter (Signed)
Pt contacted office for refill request on the following medications:  pravastatin (PRAVACHOL) 80 MG tablet.  [facility] [address].  CB#[phone]/MW

## 2015-07-07 ENCOUNTER — Other Ambulatory Visit: Payer: Self-pay | Admitting: Family Medicine

## 2015-07-10 ENCOUNTER — Other Ambulatory Visit: Payer: Self-pay | Admitting: Family Medicine

## 2015-10-03 ENCOUNTER — Other Ambulatory Visit: Payer: Self-pay | Admitting: Family Medicine

## 2015-10-03 MED ORDER — METOPROLOL TARTRATE 50 MG PO TABS: ORAL_TABLET | ORAL | 1 refills | Status: DC

## 2015-10-29 ENCOUNTER — Ambulatory Visit (INDEPENDENT_AMBULATORY_CARE_PROVIDER_SITE_OTHER): Payer: PPO | Admitting: Family Medicine

## 2015-10-29 ENCOUNTER — Encounter: Payer: Self-pay | Admitting: Family Medicine

## 2015-10-29 VITALS — BP 140/58 | HR 66 | Temp 98.2°F | Resp 16 | Wt 222.8 lb

## 2015-10-29 DIAGNOSIS — M722 Plantar fascial fibromatosis: Secondary | ICD-10-CM

## 2015-10-29 NOTE — Progress Notes (Signed)
Subjective:     Patient ID: Angela Santiago, female   DOB: September 14, 1945, 70 y.o.   MRN: [id number]  HPI  Chief Complaint  Patient presents with  . Foot Pain    Patient comes in office today with complaints of right foot pain for the past week and half. Patient denies any injury to foot or change in shoes. Patient reports swelling on bottom of foot, she states that when pain initally began it was at her arch.   States she felt it was getting better but had to walk and stand a lot on 9/23 preparing for her daughter's wedding (10/28). Normally wear sneakers. States she was intending to start at the gym until this happened. Notes that pain is worse when she first gets up after sitting. She as taken one Advil with little improvement.   Review of Systems     Objective:   Physical Exam  Constitutional: She appears well-developed and well-nourished.  Cardiovascular:  Pulses:      Dorsalis pedis pulses are 2+ on the right side.       Posterior tibial pulses are 2+ on the right side.  Musculoskeletal: She exhibits no edema (right pedal edema).  Ankle ligaments stable; DF/PF 5/5. Tender over her plantar foot at mid-arch and distally to her metatarsal area..       Assessment:    1. Plantar fasciitis of right foot    Plan:    Discussed conservative treatment. Will call if not improving for podiatry referral.

## 2015-10-29 NOTE — Patient Instructions (Addendum)
Discussed use of Tylenol extra strength two pills twice daily up to 3000 mg/day. Put in Gel inserts in the shoes you wear most often. Call me for podiatry referral if not improving. Plantar Fasciitis Plantar fasciitis is a painful foot condition that affects the heel. It occurs when the band of tissue that connects the toes to the heel bone (plantar fascia) becomes irritated. This can happen after exercising too much or doing other repetitive activities (overuse injury). The pain from plantar fasciitis can range from mild irritation to severe pain that makes it difficult for you to walk or move. The pain is usually worse in the morning or after you have been sitting or lying down for a while. CAUSES This condition may be caused by:  Standing for long periods of time.  Wearing shoes that do not fit.  Doing high-impact activities, including running, aerobics, and ballet.  Being overweight.  Having an abnormal way of walking (gait).  Having tight calf muscles.  Having high arches in your feet.  Starting a new athletic activity. SYMPTOMS The main symptom of this condition is heel pain. Other symptoms include:  Pain that gets worse after activity or exercise.  Pain that is worse in the morning or after resting.  Pain that goes away after you walk for a few minutes. DIAGNOSIS This condition may be diagnosed based on your signs and symptoms. Your health care provider will also do a physical exam to check for:  A tender area on the bottom of your foot.  A high arch in your foot.  Pain when you move your foot.  Difficulty moving your foot. You may also need to have imaging studies to confirm the diagnosis. These can include:  X-rays.  Ultrasound.  MRI. TREATMENT  Treatment for plantar fasciitis depends on the severity of the condition. Your treatment may include:  Rest, ice, and over-the-counter pain medicines to manage your pain.  Exercises to stretch your calves and your  plantar fascia.  A splint that holds your foot in a stretched, upward position while you sleep (night splint).  Physical therapy to relieve symptoms and prevent problems in the future.  Cortisone injections to relieve severe pain.  Extracorporeal shock wave therapy (ESWT) to stimulate damaged plantar fascia with electrical impulses. It is often used as a last resort before surgery.  Surgery, if other treatments have not worked after 12 months. HOME CARE INSTRUCTIONS  Take medicines only as directed by your health care provider.  Avoid activities that cause pain.  Roll the bottom of your foot over a bag of ice or a bottle of cold water. Do this for 20 minutes, 3-4 times a day.  Perform simple stretches as directed by your health care provider.  Try wearing athletic shoes with air-sole or gel-sole cushions or soft shoe inserts.  Wear a night splint while sleeping, if directed by your health care provider.  Keep all follow-up appointments with your health care provider. PREVENTION   Do not perform exercises or activities that cause heel pain.  Consider finding low-impact activities if you continue to have problems.  Lose weight if you need to. The best way to prevent plantar fasciitis is to avoid the activities that aggravate your plantar fascia. SEEK MEDICAL CARE IF:  Your symptoms do not go away after treatment with home care measures.  Your pain gets worse.  Your pain affects your ability to move or do your daily activities.   This information is not intended to replace advice  given to you by your health care provider. Make sure you discuss any questions you have with your health care provider.   Document Released: 10/15/2000 Document Revised: 10/11/2014 Document Reviewed: 11/30/2013 Elsevier Interactive Patient Education [employer]

## 2015-11-27 ENCOUNTER — Telehealth: Payer: Self-pay | Admitting: Family Medicine

## 2015-11-27 NOTE — Telephone Encounter (Signed)
Have reviewed her chart. Was called in abx last year after symptoms not improving after> one weeks.

## 2015-11-27 NOTE — Telephone Encounter (Signed)
Spoke with patient on the phone who reports that symptoms began yesterday upon awakening. Patient complains of pain only on the right side, she says she has pain behind her right eye, cheek bone and ear. Patient reports pain when swallowing on the right side radiating up to her ear. Patient states that she has been taking allergy medication but stats that last night it did not help clear symptoms. Patient states that her daughters wedding is this week and she has a busy schedule and is unable to come into office. Please advise. KW

## 2015-11-27 NOTE — Telephone Encounter (Signed)
I advised patient as below, she states that this is not a viral infection and that she has been taking Mucinex and saline rinse and has had no relief. Patient states that she had similar symptoms last year and was called in doxycycline. Please advise. KW

## 2015-11-27 NOTE — Telephone Encounter (Signed)
Symptoms are consistent with the viral infections going around right now. Would use Benadryl at night for post nasal drainage, Mucinex for an expectorant, Saline nasal spray for congestion (due to her high blood pressure), and Delsym for cough. May use Tylenol or Aleve for discomfort. It takes about 7 days for the sinuses to start feeling better and another week for cough to improve once it develops

## 2015-11-27 NOTE — Telephone Encounter (Signed)
Spoke with patient on the phone and advised as below, patient states that she refuses to buy otc medication and that day of her daughters wedding will make it a week withy symptoms. Patient request a call back from provider to discuss. KW

## 2015-11-27 NOTE — Telephone Encounter (Signed)
Discussed use of nasal decongestant spray to relieve pressure. Discussed rationale for not prescribing abx for viral symptoms.

## 2015-11-27 NOTE — Telephone Encounter (Signed)
Pt called saying she is having symptoms of a sinus infection.  Her daughter I getting married this weekend and she does not have time to come in.  She would like you to call in for her the doxycycline.  She said that usually takes care of it.    [facility] [place],  Pt's call back is [phone]

## 2015-11-27 NOTE — Telephone Encounter (Signed)
error 

## 2015-11-29 ENCOUNTER — Encounter: Payer: Self-pay | Admitting: Physician Assistant

## 2015-11-29 ENCOUNTER — Ambulatory Visit (INDEPENDENT_AMBULATORY_CARE_PROVIDER_SITE_OTHER): Payer: PPO | Admitting: Physician Assistant

## 2015-11-29 VITALS — BP 112/62 | HR 72 | Temp 97.9°F | Resp 16 | Wt 220.0 lb

## 2015-11-29 DIAGNOSIS — J019 Acute sinusitis, unspecified: Secondary | ICD-10-CM

## 2015-11-29 MED ORDER — DOXYCYCLINE HYCLATE 100 MG PO TABS: 100.0000 mg | ORAL_TABLET | Freq: Two times a day (BID) | ORAL | 0 refills | Status: DC

## 2015-11-29 NOTE — Patient Instructions (Signed)

## 2015-11-29 NOTE — Progress Notes (Signed)
Patient: Angela Santiago Female    DOB: 21-Apr-1945   70 y.o.   MRN: [id number] Visit Date: 11/29/2015  Today's Provider: Trey Sailors, PA-C   Chief Complaint  Patient presents with  . Ear Pain    Right ear pain   Subjective:    Otalgia   There is pain in the right ear. This is a new problem. The current episode started in the past 7 days (Started Sunday morning.). The problem occurs constantly. The problem has been unchanged. There has been no fever. The pain is at a severity of 6/10. Associated symptoms include coughing (No much), headaches, hearing loss (Comes and goes) and rhinorrhea. Pertinent negatives include no ear discharge or sore throat.     Allergies  Allergen Reactions  . Amoxicillin   . Lipitor  [Atorvastatin]     legs ache  . Penicillins Rash     Current Outpatient Prescriptions:  .  aspirin 325 MG tablet, Take by mouth., Disp: , Rfl:  .  Calcium Carbonate-Vitamin D 600-200 MG-UNIT CAPS, Take by mouth., Disp: , Rfl:  .  cloNIDine (CATAPRES) 0.1 MG tablet, Take 1 tablet (0.1 mg total) by mouth every 4 (four) hours. As needed for systolic bp > 170 or diastolic bp > 100, Disp: 30 tablet, Rfl: 1 .  hydrochlorothiazide (MICROZIDE) 12.5 MG capsule, Take 1 capsule (12.5 mg total) by mouth daily., Disp: 90 capsule, Rfl: 1 .  losartan (COZAAR) 100 MG tablet, TAKE 1 TABLET BY MOUTH DAILY, Disp: 90 tablet, Rfl: 3 .  metoprolol (LOPRESSOR) 50 MG tablet, TAKE 2 TABLETS BY MOUTH EVERY MORNING AND 1 TAB EVERY EVENING, Disp: 270 tablet, Rfl: 1 .  nitroGLYCERIN (NITROSTAT) 0.4 MG SL tablet, Place under the tongue., Disp: , Rfl:  .  Omega-3 Fatty Acids (FISH OIL) 1000 MG CAPS, Take by mouth. Reported on 03/21/2015, Disp: , Rfl:  .  pravastatin (PRAVACHOL) 80 MG tablet, Take 1 tablet (80 mg total) by mouth daily., Disp: 90 tablet, Rfl: 3 .  doxycycline (VIBRA-TABS) 100 MG tablet, Take 1 tablet (100 mg total) by mouth 2 (two) times daily., Disp: 20 tablet, Rfl: 0  Review  of Systems  Constitutional: Positive for fatigue. Negative for activity change, appetite change, chills, diaphoresis, fever and unexpected weight change.  HENT: Positive for congestion, ear pain, hearing loss (Comes and goes), rhinorrhea, sinus pressure (Especially on the right side.), sneezing and trouble swallowing. Negative for ear discharge, nosebleeds, postnasal drip and sore throat.   Respiratory: Positive for cough (No much). Negative for apnea, choking, chest tightness, shortness of breath, wheezing and stridor.   Gastrointestinal: Negative.   Allergic/Immunologic: Positive for environmental allergies (Pt reports she has fall allergies. ).  Neurological: Positive for light-headedness and headaches. Negative for dizziness.    Social History  Substance Use Topics  . Smoking status: Former Games developer  . Smokeless tobacco: Not on file  . Alcohol use No   Objective:   BP 112/62 (BP Location: Left Arm, Patient Position: Sitting, Cuff Size: Large)   Pulse 72   Temp 97.9 F (36.6 C) (Oral)   Resp 16   Wt 220 lb (99.8 kg)   BMI 40.24 kg/m   Physical Exam      Assessment & Plan:      Problem List Items Addressed This Visit    None    Visit Diagnoses    Acute sinusitis, recurrence not specified, unspecified location    -  Primary   Relevant  Medications   doxycycline (VIBRA-TABS) 100 MG tablet      Patient is presenting with 4 days of sinus symptoms. Have counseled this patient that this is likely viral syndrome, as Nadine CountsBob explained on phone. Viral sinusitis lasts about 7-10 days before we consider antibacterial treatment. Patient is insistent that she gets this every year and has been treated with doxycycline to clear it up. She received doxycycline last year for similar symptoms, which was called in after >1 week of symptoms. When telling patient I would rather not prescribe an antibiotic, she says "well I guess I just won't go to my daughter's wedding." I have counseled her on viral  nature of sinusitis and risks of antibiotics including nausea, diarrhea, C. Diff.  I have been explicit with patient that I do not think antibiotics will be of any help to her at this point. However, due to her busy weekend preparing for her daughter's wedding, I have written a script for doxycycline that she may fill and start taking if symptoms persist past Sunday. I have cautioned her not to expect same prescribing practice from other providers.       Trey SailorsAdriana M Pollak, PA-C  [facility]

## 2015-12-13 ENCOUNTER — Other Ambulatory Visit: Payer: Self-pay | Admitting: Family Medicine

## 2015-12-13 DIAGNOSIS — I10 Essential (primary) hypertension: Secondary | ICD-10-CM

## 2015-12-14 ENCOUNTER — Ambulatory Visit: Payer: Self-pay | Admitting: Family Medicine

## 2015-12-16 DIAGNOSIS — L237 Allergic contact dermatitis due to plants, except food: Secondary | ICD-10-CM | POA: Diagnosis not present

## 2016-01-16 ENCOUNTER — Ambulatory Visit (INDEPENDENT_AMBULATORY_CARE_PROVIDER_SITE_OTHER): Payer: PPO | Admitting: Family Medicine

## 2016-01-16 ENCOUNTER — Encounter: Payer: Self-pay | Admitting: Family Medicine

## 2016-01-16 VITALS — BP 132/82 | HR 68 | Temp 98.1°F | Resp 16 | Wt 223.2 lb

## 2016-01-16 DIAGNOSIS — R21 Rash and other nonspecific skin eruption: Secondary | ICD-10-CM | POA: Diagnosis not present

## 2016-01-16 MED ORDER — PREDNISONE 10 MG PO TABS: ORAL_TABLET | ORAL | 0 refills | Status: DC

## 2016-01-16 MED ORDER — HYDROXYZINE HCL 25 MG PO TABS: 25.0000 mg | ORAL_TABLET | Freq: Three times a day (TID) | ORAL | 0 refills | Status: DC | PRN

## 2016-01-16 NOTE — Progress Notes (Signed)
Subjective:     Patient ID: Angela Santiago, female   DOB: 04/26/1945, 70 y.o.   MRN: [id number]  HPI  Chief Complaint  Patient presents with  . Rash    Patient comes in office toay with concerns of rash that appeared on her body on 12/03/15. Patient states that rash initially was on arms and has spread all over, she describes rash as extremley itchy, read and rised. Patient was seen at [facility] and precsribed Triamcinolone Acetonide 0.1% and Desonide Cream 0.05%. Patient states that cream has not helped and itching has not improved. Patient denies any new food, hair, skin products or new detergent use.   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 "Alcohol makes it feel better for a while." States she did not take antibiotic prescribed at last office visit. Reports irritability/jittery with high doses of prednisone. Reports anxiety about her finances.   Review of Systems     Objective:   Physical Exam  Constitutional: She appears well-developed and well-nourished. No distress.  Pulmonary/Chest: Breath sounds normal.  Skin:  Numerous 4-5 mm. Minimally erythematous papules on her extremities and upper back. No pustules or vesicles.       Assessment:    1. Rash of entire body - predniSONE (DELTASONE) 10 MG tablet; Take 3 pills daily for 7 days.  Dispense: 21 tablet; Refill: 0 - hydrOXYzine (ATARAX/VISTARIL) 25 MG tablet; Take 1 tablet (25 mg total) by mouth every 8 (eight) hours as needed for anxiety or itching.  Dispense: 12 tablet; Refill: 0    Plan:    Consider dermatology referral if not improving.

## 2016-01-16 NOTE — Patient Instructions (Signed)
Do let me know if not improving and we will send you to dermatology. Do not take Benadryl and the hydroxyzine.

## 2016-01-16 NOTE — Progress Notes (Signed)
ra

## 2016-01-17 ENCOUNTER — Other Ambulatory Visit: Payer: Self-pay | Admitting: Family Medicine

## 2016-01-17 ENCOUNTER — Telehealth: Payer: Self-pay | Admitting: Family Medicine

## 2016-01-17 DIAGNOSIS — E782 Mixed hyperlipidemia: Secondary | ICD-10-CM

## 2016-01-17 MED ORDER — PRAVASTATIN SODIUM 80 MG PO TABS: 80.0000 mg | ORAL_TABLET | Freq: Every day | ORAL | 3 refills | Status: AC

## 2016-01-17 NOTE — Telephone Encounter (Signed)
Pt needs refill for  pravastatin (PRAVACHOL) 80 MG tablet  Taking 06/28/15 -- Anola Gurney, PA    Take 1 tablet (80 mg total) by mouth daily.    [facility]  She said she picked up the metoprolol (LOPRESSOR) 50 MG tablet  ThanksTeri

## 2016-01-17 NOTE — Telephone Encounter (Signed)
Patient advised.

## 2016-01-17 NOTE — Telephone Encounter (Signed)
Pravastatin refilled.  

## 2016-04-12 ENCOUNTER — Other Ambulatory Visit: Payer: Self-pay | Admitting: Family Medicine

## 2016-07-01 ENCOUNTER — Other Ambulatory Visit: Payer: Self-pay | Admitting: Family Medicine

## 2016-09-07 ENCOUNTER — Other Ambulatory Visit: Payer: Self-pay | Admitting: Family Medicine

## 2016-09-07 DIAGNOSIS — I10 Essential (primary) hypertension: Secondary | ICD-10-CM

## 2016-10-23 ENCOUNTER — Other Ambulatory Visit: Payer: Self-pay | Admitting: Family Medicine

## 2016-12-15 ENCOUNTER — Ambulatory Visit (INDEPENDENT_AMBULATORY_CARE_PROVIDER_SITE_OTHER): Payer: PPO | Admitting: Family Medicine

## 2016-12-15 DIAGNOSIS — Z23 Encounter for immunization: Secondary | ICD-10-CM | POA: Diagnosis not present

## 2016-12-15 NOTE — Progress Notes (Signed)
Patient ID: Angela Santiago, female   DOB: 08-25-1945, 71 y.o.   MRN: [id number] Here for Tdap immunization update with the CMA only.

## 2016-12-30 ENCOUNTER — Ambulatory Visit: Payer: PPO | Admitting: Family Medicine

## 2016-12-30 ENCOUNTER — Other Ambulatory Visit: Payer: Self-pay | Admitting: Family Medicine

## 2016-12-30 ENCOUNTER — Encounter: Payer: Self-pay | Admitting: Family Medicine

## 2016-12-30 VITALS — BP 170/92 | HR 76 | Temp 98.0°F | Resp 17 | Wt 217.0 lb

## 2016-12-30 DIAGNOSIS — E7800 Pure hypercholesterolemia, unspecified: Secondary | ICD-10-CM | POA: Diagnosis not present

## 2016-12-30 DIAGNOSIS — I10 Essential (primary) hypertension: Secondary | ICD-10-CM | POA: Diagnosis not present

## 2016-12-30 MED ORDER — CHLORTHALIDONE 25 MG PO TABS: 25.0000 mg | ORAL_TABLET | Freq: Every day | ORAL | 0 refills | Status: AC

## 2016-12-30 NOTE — Patient Instructions (Addendum)
Let me know if you can't tolerate the medication prior to your appointment.

## 2016-12-30 NOTE — Progress Notes (Signed)
Subjective:     Patient ID: Angela Santiago, female   DOB: [birth year], 71 y.o.   MRN: [id number] Chief Complaint  Patient presents with  . Hypertension    Patient comes in office today with concerns of elevated blood pressure for the past two weeks. Patient reports that systolic reading ranges from 150-197 and diastolic 80-90s. Patient reports symptoms of headache and difficulty taking deep breaths.   States she has been having frequent spells over the last 2-3 weeks where she will get up to walk, develop a headache and shortness of breath. She will find her bp elevated and will take prn clonidine. Episodes can last as long as an hour. Reports compliance with her other bp medications. She has a new granddaughter Clinical biochemist) and wishes be around for her. HPI   Review of Systems     Objective:   Physical Exam  Constitutional: She appears well-developed and well-nourished. No distress.  Cardiovascular: Normal rate and regular rhythm.  Pulmonary/Chest: Breath sounds normal.  Musculoskeletal: She exhibits no edema (no pitting edema of lower extremities though LLE has chronic swelling from prior orthopedic injury. ).       Assessment:    1. Essential hypertension: D/c HCTZ; Start chlorthalidone 25 mg. Daily #30 - COMPLETE METABOLIC PANEL WITH GFR - Ambulatory referral to Cardiology  2. Hypercholesteremia - Lipid panel    Plan:    F/u pending lab results.

## 2016-12-31 ENCOUNTER — Telehealth: Payer: Self-pay | Admitting: Family Medicine

## 2016-12-31 ENCOUNTER — Encounter: Payer: Self-pay | Admitting: Family Medicine

## 2016-12-31 ENCOUNTER — Ambulatory Visit (INDEPENDENT_AMBULATORY_CARE_PROVIDER_SITE_OTHER): Payer: PPO | Admitting: Family Medicine

## 2016-12-31 VITALS — BP 150/80 | HR 88 | Temp 98.8°F | Resp 17

## 2016-12-31 DIAGNOSIS — I10 Essential (primary) hypertension: Secondary | ICD-10-CM | POA: Diagnosis not present

## 2016-12-31 DIAGNOSIS — E7800 Pure hypercholesterolemia, unspecified: Secondary | ICD-10-CM | POA: Diagnosis not present

## 2016-12-31 DIAGNOSIS — F419 Anxiety disorder, unspecified: Secondary | ICD-10-CM | POA: Diagnosis not present

## 2016-12-31 LAB — COMPLETE METABOLIC PANEL WITH GFR
AG RATIO: 1.2 (calc) (ref 1.0–2.5)
ALKALINE PHOSPHATASE (APISO): 69 U/L (ref 33–130)
ALT: 32 U/L — AB (ref 6–29)
AST: 21 U/L (ref 10–35)
Albumin: 3.6 g/dL (ref 3.6–5.1)
BILIRUBIN TOTAL: 1.1 mg/dL (ref 0.2–1.2)
BUN/Creatinine Ratio: 12 (calc) (ref 6–22)
BUN: 14 mg/dL (ref 7–25)
CHLORIDE: 104 mmol/L (ref 98–110)
CO2: 29 mmol/L (ref 20–32)
Calcium: 9.1 mg/dL (ref 8.6–10.4)
Creat: 1.21 mg/dL — ABNORMAL HIGH (ref 0.60–0.93)
GFR, Est African American: 52 mL/min/{1.73_m2} — ABNORMAL LOW (ref >=60)
GFR, Est Non African American: 45 mL/min/{1.73_m2} — ABNORMAL LOW (ref >=60)
GLUCOSE: 165 mg/dL — AB (ref 65–99)
Globulin: 3 g/dL (calc) (ref 1.9–3.7)
POTASSIUM: 4.6 mmol/L (ref 3.5–5.3)
Sodium: 143 mmol/L (ref 135–146)
Total Protein: 6.6 g/dL (ref 6.1–8.1)

## 2016-12-31 LAB — LIPID PANEL
CHOL/HDL RATIO: 4.8 (calc) (ref <5.0)
Cholesterol: 210 mg/dL — ABNORMAL HIGH (ref <200)
HDL: 44 mg/dL — AB (ref >50)
LDL Cholesterol (Calc): 129 mg/dL (calc) — ABNORMAL HIGH
NON-HDL CHOLESTEROL (CALC): 166 mg/dL — AB (ref <130)
TRIGLYCERIDES: 230 mg/dL — AB (ref <150)

## 2016-12-31 MED ORDER — SERTRALINE HCL 50 MG PO TABS: 50.0000 mg | ORAL_TABLET | Freq: Every day | ORAL | 1 refills | Status: AC

## 2016-12-31 NOTE — Telephone Encounter (Signed)
Pt states she would like to wait until after 01/13/17 before she goes to see Dr Gwen Pounds.Just wanted to make sure you approve of her waiting this long

## 2016-12-31 NOTE — Telephone Encounter (Signed)
ok 

## 2016-12-31 NOTE — Patient Instructions (Signed)
Let me know how you are doing on the sertraline in the next two weeks.

## 2016-12-31 NOTE — Telephone Encounter (Signed)
Pt contacted office for refill request on the following medications:  nitroGLYCERIN (NITROSTAT) 0.4 MG SL tablet  [facility] [address]  I don't see where Nadine Counts has prescribed this medication for pt in the past but pt stated that Bob's name was on her bottle. Pt stated she only has 2 tablets remaining and pt was advised Nadine Counts is out of the office this afternoon. Please advise. Thanks TNP

## 2016-12-31 NOTE — Telephone Encounter (Signed)
Please review chart and advise. KW 

## 2016-12-31 NOTE — Progress Notes (Addendum)
Subjective:     Patient ID: Angela Santiago, female   DOB: [birth year], 71 y.o.   MRN: [id number] Chief Complaint  Patient presents with  . Shortness of Breath    Patient comes in  this morning with complaints of shortness of breath once leaving her house this morning. Patient denies feeling light headed or dizzy  Saw her in the office yesterday and we discussed similar spells. She has not started yet on chlorthalidone and cardiology referral is pending. Spells are stereotypical and suggestive of anxiety attacks. HPI   Review of Systems     Objective:   Physical Exam  Constitutional: She appears well-developed and well-nourished. No distress.  Cardiovascular: Normal rate and regular rhythm.  Pulmonary/Chest: Breath sounds normal. She has no wheezes.  Psychiatric:  Anxious but calms down as we discuss her symptoms. She is in agreement that anxiety may play a part in her sx.       Assessment:    1. Anxiety: start sertraline    Plan:    Phone f/u in two weeks and pending lab work.

## 2017-01-01 ENCOUNTER — Telehealth: Payer: Self-pay

## 2017-01-01 ENCOUNTER — Other Ambulatory Visit: Payer: Self-pay | Admitting: Family Medicine

## 2017-01-01 MED ORDER — NITROGLYCERIN 0.4 MG SL SUBL: 0.4000 mg | SUBLINGUAL_TABLET | SUBLINGUAL | 1 refills | Status: AC | PRN

## 2017-01-01 NOTE — Telephone Encounter (Signed)
-----   Message from Anola Gurney, [place] sent at 01/01/2017  7:31 AM EST ----- Labs show cholesterol is not under good control and your sugar is high. Will discuss further at the next office visit.

## 2017-01-01 NOTE — Telephone Encounter (Signed)
[id number] 

## 2017-01-02 NOTE — Telephone Encounter (Signed)
Unable to deliver message patient is deceased. KW

## 2017-01-03 DEATH — deceased

## 2017-01-13 ENCOUNTER — Ambulatory Visit: Payer: Self-pay | Admitting: Family Medicine

## 2017-01-19 ENCOUNTER — Other Ambulatory Visit: Payer: Self-pay | Admitting: Family Medicine

## 2017-01-19 NOTE — Telephone Encounter (Signed)
[facility] [address] faxed refill request for the following medications:  metoprolol tartrate (LOPRESSOR) 50 MG tablet  90 day supply  Last Rx: 10/23/16 LOV: 12/31/16 Please advise. Thanks TNP

## 2017-01-20 NOTE — Telephone Encounter (Signed)
Pharmacy has been notifed. KW

## 2017-01-20 NOTE — Telephone Encounter (Signed)
Let them know the patient is deceased.

## 2017-01-20 NOTE — Telephone Encounter (Signed)
Disregard refill request? KW

## 2017-01-21 ENCOUNTER — Other Ambulatory Visit: Payer: Self-pay | Admitting: Family Medicine
